# Patient Record
Sex: Female | Born: 1985 | Race: White | Hispanic: No | Marital: Single | Smoking: Current every day smoker
Health system: Southern US, Community
[De-identification: ages and names within clinical notes are randomized; demographics above are authoritative.]

---

## 2009-12-11 ENCOUNTER — Encounter (INDEPENDENT_AMBULATORY_CARE_PROVIDER_SITE_OTHER): Payer: Self-pay | Admitting: *Deleted

## 2009-12-11 ENCOUNTER — Ambulatory Visit: Payer: Self-pay | Admitting: Emergency Medicine

## 2009-12-11 LAB — CONVERTED CEMR LAB: Rapid Strep: POSITIVE

## 2010-03-12 NOTE — Assessment & Plan Note (Signed)
Summary: SORE THROAT/WB   Vital Signs:  Patient Profile:   25 Years Old Female CC:      sore throat, cough, HA x 2-3 days Height:     62 inches Weight:      137 pounds O2 Sat:      98 % O2 treatment:    Room Air Temp:     99.2 degrees F oral Pulse rate:   108 / minute Resp:     16 per minute BP sitting:   123 / 81  (left arm) Cuff size:   regular  Vitals Entered By: Lajean Saver RN (December 11, 2009 1:14 PM)                  Updated Prior Medication List: No Medications Current Allergies: ! * MINOCYCLINEHistory of Present Illness History from: patient Chief Complaint: sore throat, cough, HA x 2-3 days History of Present Illness: Patient complains of onset of cold symptoms for 3 days.  They have been using no OTC meds.  She smokes occasionally and works around cigarette smoke.  She also reports having yeast infx symptoms.  She was on Minocin up until a few weeks ago for acne, but stopped due to side effects.  She will start Keflex in a few weeks.  She has a family history of throat and esophagus problems. + sore throat No cough No pleuritic pain No wheezing No nasal congestion No post-nasal drainage No sinus pain/pressure No itchy/red eyes No earache No hemoptysis No SOB No chills/sweats No fever No nausea No vomiting No abdominal pain No diarrhea No skin rashes No fatigue No myalgias No headache   REVIEW OF SYSTEMS Constitutional Symptoms      Denies fever, chills, night sweats, weight loss, weight gain, and fatigue.  Eyes       Denies change in vision, eye pain, eye discharge, glasses, contact lenses, and eye surgery. Ear/Nose/Throat/Mouth       Complains of sore throat.      Denies hearing loss/aids, change in hearing, ear pain, ear discharge, dizziness, frequent runny nose, frequent nose bleeds, sinus problems, hoarseness, and tooth pain or bleeding.  Respiratory       Complains of dry cough.      Denies productive cough, wheezing, shortness of  breath, asthma, bronchitis, and emphysema/COPD.  Cardiovascular       Denies murmurs, chest pain, and tires easily with exhertion.    Gastrointestinal       Denies stomach pain, nausea/vomiting, diarrhea, constipation, blood in bowel movements, and indigestion. Genitourniary       Denies painful urination, kidney stones, and loss of urinary control. Neurological       Complains of headaches.      Denies paralysis, seizures, and fainting/blackouts. Musculoskeletal       Denies muscle pain, joint pain, joint stiffness, decreased range of motion, redness, swelling, muscle weakness, and gout.  Skin       Denies bruising, unusual mles/lumps or sores, and hair/skin or nail changes.  Psych       Denies mood changes, temper/anger issues, anxiety/stress, speech problems, depression, and sleep problems.  Past History:  Past Medical History: Unremarkable  Past Surgical History: Denies surgical history  Family History: none  Social History: Never Smoked Alcohol use-no Drug use-no Smoking Status:  never Drug Use:  no Physical Exam General appearance: well developed, well nourished, no acute distress Ears: normal, no lesions or deformities Nasal: mucosa pink, nonedematous, no septal deviation, turbinates normal Oral/Pharynx:  pharyngeal erythema without exudate, uvula midline without deviation Neck: L ant cerv LAD tender Chest/Lungs: no rales, wheezes, or rhonchi bilateral, breath sounds equal without effort Heart: regular rate and  rhythm, no murmur Skin: no obvious rashes or lesions MSE: oriented to time, place, and person Assessment New Problems: STREP THROAT (ICD-034.0) UPPER RESPIRATORY INFECTION, ACUTE (ICD-465.9)  Rapid strep +  Patient Education: Patient and/or caregiver instructed in the following: rest, fluids, Tylenol prn, quit smoking.  Plan New Medications/Changes: PREDNISONE 10 MG TABS (PREDNISONE) 20mg  two times a day for 3 days  #QS x 0, 12/11/2009, Hoyt Koch MD FLUCONAZOLE 150 MG TABS (FLUCONAZOLE) 1 tab x1.  May repeat in 2-3 days if symptoms not better  #2 x 0, 12/11/2009, Hoyt Koch MD PENICILLIN V POTASSIUM 500 MG TABS (PENICILLIN V POTASSIUM) 1 tab by mouth three times a day for 10 days  #20 x 0, 12/11/2009, Hoyt Koch MD  New Orders: New Patient Level III [id number] Rapid Strep [id number] Planning Comments:   Since this is a recurrent problem and you are around a lot of smoke and because of a family history of throat problems, refer to ENT for eval and treat Take meds as directed Follow-up with your primary care physician if not improving or if getting worse  Drink lots of water Change your toothbrush in 2 days You are contagious for 24 hours after starting the antibiotics   The patient and/or caregiver has been counseled thoroughly with regard to medications prescribed including dosage, schedule, interactions, rationale for use, and possible side effects and they verbalize understanding.  Diagnoses and expected course of recovery discussed and will return if not improved as expected or if the condition worsens. Patient and/or caregiver verbalized understanding.  Prescriptions: PREDNISONE 10 MG TABS (PREDNISONE) 20mg  two times a day for 3 days  #QS x 0   Entered and Authorized by:   Hoyt Koch MD   Signed by:   Hoyt Koch MD on 12/11/2009   Method used:   Print then Give to Patient   RxID:   [id number] FLUCONAZOLE 150 MG TABS (FLUCONAZOLE) 1 tab x1.  May repeat in 2-3 days if symptoms not better  #2 x 0   Entered and Authorized by:   Hoyt Koch MD   Signed by:   Hoyt Koch MD on 12/11/2009   Method used:   Print then Give to Patient   RxID:   [id number] PENICILLIN V POTASSIUM 500 MG TABS (PENICILLIN V POTASSIUM) 1 tab by mouth three times a day for 10 days  #20 x 0   Entered and Authorized by:   Hoyt Koch MD   Signed by:   Hoyt Koch MD on 12/11/2009   Method  used:   Print then Give to Patient   RxID:   [id number]   Orders Added: 1)  New Patient Level III [35573] 2)  Rapid Strep [22025]    Laboratory Results  Date/Time Received: December 11, 2009 1:28 PM  Date/Time Reported: December 11, 2009 1:28 PM   Other Tests  Rapid Strep: positive  Kit Test Internal QC: Negative   (Normal Range: Negative)

## 2010-03-12 NOTE — Letter (Signed)
Summary: Out of Work  [facility]  [address]   [place], [place] [address]   Phone: [phone]  Fax: [phone]    December 11, 2009   Employee:  Margot Chimes    To Whom It May Concern:   For Medical reasons, please excuse the above named employee from work for the following dates:  Start: November 1st, 2011    End:  November 2nd, 2011    If you need additional information, please feel free to contact our office.         Sincerely,    Lajean Saver RN

## 2018-02-28 ENCOUNTER — Other Ambulatory Visit: Payer: Self-pay

## 2018-02-28 ENCOUNTER — Emergency Department (INDEPENDENT_AMBULATORY_CARE_PROVIDER_SITE_OTHER)
Admission: EM | Admit: 2018-02-28 | Discharge: 2018-02-28 | Disposition: A | Discharge status: Home / Self Care | Payer: Medicaid Other | Source: Home / Self Care

## 2018-02-28 DIAGNOSIS — R69 Illness, unspecified: Secondary | ICD-10-CM | POA: Diagnosis not present

## 2018-02-28 DIAGNOSIS — J111 Influenza due to unidentified influenza virus with other respiratory manifestations: Secondary | ICD-10-CM

## 2018-02-28 MED ORDER — OSELTAMIVIR PHOSPHATE 75 MG PO CAPS: 75.0000 mg | ORAL_CAPSULE | Freq: Two times a day (BID) | ORAL | 0 refills | Status: DC

## 2018-02-28 NOTE — ED Triage Notes (Signed)
Pt c/o flu likes sxs for 4 days. Here today with daughter who has same sxs. Taking tylenol and motrin prn.

## 2018-02-28 NOTE — ED Provider Notes (Signed)
[facility]    CSN: [id number] Arrival date & time: 02/28/18  1601     History   Chief Complaint Chief Complaint  Patient presents with  . Headache  . Cough    sore throat  . Generalized Body Aches    HPI Anna Randall is a 33 y.o. female.   HPI Patient presented today concern for flu. She endorse 3 days of cough-non-productive, body aches, sore throat, and headache which has persisted. No known exposure. Her daughter is now sick. Unknown if fever. She has taken OTC medications for headache without relief of symptoms. Denies shortness of breath, wheezing, chest tightness, dizziness, or nausea.  Home Medications    Prior to Admission medications   Not on File    Family History No family history on file.  Social History Social History   Tobacco Use  . Smoking status: Not on file  Substance Use Topics  . Alcohol use: Not on file  . Drug use: Not on file     Allergies   Minocycline   Review of Systems Review of Systems Pertinent negatives listed in HPI Physical Exam Triage Vital Signs ED Triage Vitals  Enc Vitals Group     BP      Pulse      Resp      Temp      Temp src      SpO2      Weight      Height      Head Circumference      Peak Flow      Pain Score      Pain Loc      Pain Edu?      Excl. in [place]?    No data found.  Updated Vital Signs BP 117/81 (BP Location: Right Arm)   Pulse 81   Temp 98.1 F (36.7 C) (Oral)   Resp 18   Ht 5\' 2"  (1.575 m)   SpO2 99%   BMI 25.06 kg/m   Visual Acuity Right Eye Distance:   Left Eye Distance:   Bilateral Distance:    Right Eye Near:   Left Eye Near:    Bilateral Near:     Physical Exam General appearance: alert, well developed, appears acutely ill, cooperative and in no distress Head: Normocephalic, without obvious abnormality, atraumatic Nares: erythematous , mild congestion Ears: Bilaterally TM normal  Respiratory: Respirations even and unlabored, normal respiratory  rate Extremities: No gross deformities Skin: Skin color, texture, turgor normal. No rashes seen  Psych: Appropriate mood and affect. Neurologic: Mental status: Alert, oriented to person, place, and time, thought content appropriate. UC Treatments / Results  Labs (all labs ordered are listed, but only abnormal results are displayed) Labs Reviewed - No data to display  EKG None  Radiology No results found.  Procedures Procedures (including critical care time)  Medications Ordered in UC Medications - No data to display  Initial Impression / Assessment and Plan / UC Course  I have reviewed the triage vital signs and the nursing notes.  Pertinent labs & imaging results that were available during my care of the patient were reviewed by me and considered in my medical decision making (see chart for details).   Treating for a flu-like illness given symptoms and based on clinical judgement. Opt to forego influenza rapid test, given the length of time symptoms have been present. Treating Tamiflu based on symptoms. An After Visit Summary was printed and given to the patient. Precautions  discussed. Red flags discussed. Questions invited and answered. They voiced understanding and agreement.  Final Clinical Impressions(s) / UC Diagnoses   Final diagnoses:  Influenza-like illness     Discharge Instructions     For headache I recommend alternation tylenol and acetaminophen.  Rest and hydration will facilitate quicker resolution of symptoms.     ED Prescriptions    Medication Sig Dispense Auth. Provider   oseltamivir (TAMIFLU) 75 MG capsule Take 1 capsule (75 mg total) by mouth 2 (two) times daily. 10 capsule Bing Neighbors, FNP     Controlled Substance Prescriptions [place] Controlled Substance Registry consulted? Not Applicable   Bing Neighbors, FNP 02/28/18 1754

## 2018-02-28 NOTE — Discharge Instructions (Addendum)
For headache I recommend alternation tylenol and acetaminophen.  Rest and hydration will facilitate quicker resolution of symptoms.

## 2018-08-01 ENCOUNTER — Emergency Department (INDEPENDENT_AMBULATORY_CARE_PROVIDER_SITE_OTHER)
Admission: EM | Admit: 2018-08-01 | Discharge: 2018-08-01 | Disposition: A | Discharge status: Home / Self Care | Payer: Medicaid Other | Source: Home / Self Care | Attending: Family Medicine | Admitting: Family Medicine

## 2018-08-01 ENCOUNTER — Other Ambulatory Visit: Payer: Self-pay

## 2018-08-01 ENCOUNTER — Emergency Department (INDEPENDENT_AMBULATORY_CARE_PROVIDER_SITE_OTHER): Payer: Medicaid Other

## 2018-08-01 DIAGNOSIS — M869 Osteomyelitis, unspecified: Secondary | ICD-10-CM

## 2018-08-01 DIAGNOSIS — R102 Pelvic and perineal pain: Secondary | ICD-10-CM

## 2018-08-01 MED ORDER — PREDNISONE 20 MG PO TABS: ORAL_TABLET | ORAL | 0 refills | Status: DC

## 2018-08-01 NOTE — ED Provider Notes (Signed)
[facility]    CSN: [id number] Arrival date & time: 08/01/18  1517      History   Chief Complaint Chief Complaint  Patient presents with  . Abdominal Pain    HPI Anna Randall is a 33 y.o. female.   Patient complains of persistent lower abdominal/pelvic pain for about a month.  She points to an area just below a C-section scar.  She has minimal vaginal discharge and last menstrual period was two weeks ago.  She feels well otherwise. She was evaluated by her GYN on 07/13/18 with unremarkable pelvic exam and negative STD testing, including BV.  Pregnancy testing was negative as well.   Patient is requesting a pelvic ultrasound.  She states that she has a past history of ovarian cysts. OB history:  G2P2.  Vaginal delivery 17 years ago, and C-section 7 years ago.  The history is provided by the patient.  Abdominal Pain Pain location:  Suprapubic Pain quality: aching and shooting   Pain radiates to:  Does not radiate Pain severity:  Moderate Onset quality:  Sudden Duration:  4 weeks Timing:  Intermittent Progression:  Worsening Chronicity:  New Context: awakening from sleep, previous surgery and recent sexual activity   Context: not eating, not recent illness and not trauma   Relieved by:  Nothing Worsened by:  Movement (intercourse) Ineffective treatments:  None tried Associated symptoms: no anorexia, no chills, no constipation, no diarrhea, no dysuria, no fatigue, no fever, no hematuria, no melena, no nausea, no vaginal bleeding, no vaginal discharge and no vomiting   Risk factors: pregnancy     Past medical history negative  Active problems:  none   Past surgical history:  C-section  OB History    G2P2      Home Medications    Prior to Admission medications   Medication Sig Start Date End Date Taking? Authorizing Provider  predniSONE (DELTASONE) 20 MG tablet Take one tab by mouth twice daily for 4 days, then one daily. Take with food. 08/01/18    Kandra Nicolas, MD    Family History Ovarian cancer maternal aunt  Social History Social History   Tobacco Use  . Smoking status: Quit 09/10/09  . Smokeless tobacco: Never Used  Substance Use Topics  . Alcohol use: Not on file  . Drug use: Not on file     Allergies   Minocycline   Review of Systems Review of Systems  Constitutional: Negative for chills, fatigue and fever.  Gastrointestinal: Positive for abdominal pain. Negative for anorexia, constipation, diarrhea, melena, nausea and vomiting.  Genitourinary: Negative for dysuria, hematuria, vaginal bleeding and vaginal discharge.  All other systems reviewed and are negative.    Physical Exam Triage Vital Signs ED Triage Vitals  Enc Vitals Group     BP 08/01/18 1602 107/67     Pulse Rate 08/01/18 1602 81     Resp 08/01/18 1602 16     Temp 08/01/18 1602 98.3 F (36.8 C)     Temp Source 08/01/18 1602 Oral     SpO2 08/01/18 1602 100 %     Weight --      Height --      Head Circumference --      Peak Flow --      Pain Score 08/01/18 1603 4     Pain Loc --      Pain Edu? --      Excl. in [place]? --    No data found.  Updated  Vital Signs BP 107/67 (BP Location: Right Arm)   Pulse 81   Temp 98.3 F (36.8 C) (Oral)   Resp 16   SpO2 100%   Visual Acuity Right Eye Distance:   Left Eye Distance:   Bilateral Distance:    Right Eye Near:   Left Eye Near:    Bilateral Near:     Physical Exam Vitals signs and nursing note reviewed.  Constitutional:      General: She is not in acute distress. HENT:     Head: Normocephalic.     Nose: Nose normal.     Mouth/Throat:     Mouth: Mucous membranes are moist.  Eyes:     Pupils: Pupils are equal, round, and reactive to light.  Neck:     Musculoskeletal: Normal range of motion.  Cardiovascular:     Heart sounds: Normal heart sounds.  Pulmonary:     Breath sounds: Normal breath sounds.  Abdominal:     General: Abdomen is flat.     Palpations: There is no  mass.     Tenderness: There is no abdominal tenderness. There is no right CVA tenderness, left CVA tenderness or guarding.     Hernia: No hernia is present.  Musculoskeletal:     Right lower leg: No edema.     Left lower leg: No edema.       Legs:     Comments: Patient has distinct tenderness over symphysis pubis bilaterally.  Palpation there during resisted lateral adduction, and flexion of hips, recreates her lower abdominal pain   Skin:    General: Skin is warm and dry.     Findings: No rash.  Neurological:     Mental Status: She is alert.      UC Treatments / Results  Labs (all labs ordered are listed, but only abnormal results are displayed) Labs Reviewed - No data to display  EKG None  Radiology No results found.  Procedures Procedures (including critical care time)  Medications Ordered in UC Medications - No data to display  Initial Impression / Assessment and Plan / UC Course  I have reviewed the triage vital signs and the nursing notes.  Pertinent labs & imaging results that were available during my care of the patient were reviewed by me and considered in my medical decision making (see chart for details).    Begin prednisone burst/taper. Patient's pain appears to be primarily a result of osteitis pubis.  However, will order pelvic ultrasound at patient's request. Followup with Dr. Rodney Langtonhomas Thekkekandam or Dr. Clementeen GrahamEvan [facility]y ([facility] for further evaluation.   Final Clinical Impressions(s) / UC Diagnoses   Final diagnoses:  Osteitis pubis (HCC)  Pelvic pain     Discharge Instructions     Apply ice pack for 20 to 30 minutes, 3 to 4 times daily  Continue until pain and swelling decrease.  Begin stretching exercises as tolerated   ED Prescriptions    Medication Sig Dispense Auth. Provider   predniSONE (DELTASONE) 20 MG tablet Take one tab by mouth twice daily for 4 days, then one daily. Take with food. 12 tablet Lattie HawBeese, Anagabriela Jokerst A, MD          Lattie HawBeese, Hyder Deman A, MD 08/01/18 [phone]

## 2018-08-01 NOTE — ED Triage Notes (Signed)
Pt c/o lower pelvic pain x 60mos. Pt has seen OB and has had neg pregnancy test, UA, and STD test. Pain wrose with intercourse. HX of ovarian cysts.

## 2018-08-01 NOTE — Discharge Instructions (Signed)
Apply ice pack for 20 to 30 minutes, 3 to 4 times daily  Continue until pain and swelling decrease.  Begin stretching exercises as tolerated

## 2018-08-02 ENCOUNTER — Telehealth: Payer: Self-pay

## 2018-08-02 NOTE — Telephone Encounter (Signed)
Tried to call to schedule [place]. Both her and emergency contact numbers are not in use/disconnected. Also found pts drivers license in office this morning.

## 2018-08-02 NOTE — Telephone Encounter (Signed)
Pt called back to schedule [place]. Spoke to [employer] down in [place]. Appt Thurs 6/26  at 10:15am. Pt confirmed.

## 2018-08-05 ENCOUNTER — Other Ambulatory Visit: Payer: Self-pay | Admitting: Family Medicine

## 2018-08-05 ENCOUNTER — Telehealth: Payer: Self-pay

## 2018-08-05 ENCOUNTER — Ambulatory Visit (INDEPENDENT_AMBULATORY_CARE_PROVIDER_SITE_OTHER): Payer: Medicaid Other

## 2018-08-05 ENCOUNTER — Other Ambulatory Visit: Payer: Self-pay

## 2018-08-05 DIAGNOSIS — R102 Pelvic and perineal pain: Secondary | ICD-10-CM

## 2018-08-05 NOTE — Telephone Encounter (Signed)
Notified patient of [place] results and to follow up with OBGYN for follow up US and results.

## 2018-10-28 ENCOUNTER — Telehealth: Payer: Self-pay | Admitting: Emergency Medicine

## 2018-10-28 NOTE — Telephone Encounter (Signed)
Patient called wanted new rx for prednisone which was written 08/01/18, I advised her she would need to be re evaluated.She hung up on me.

## 2020-06-11 ENCOUNTER — Other Ambulatory Visit: Payer: Self-pay

## 2020-06-11 ENCOUNTER — Emergency Department (INDEPENDENT_AMBULATORY_CARE_PROVIDER_SITE_OTHER)
Admission: RE | Admit: 2020-06-11 | Discharge: 2020-06-11 | Disposition: A | Discharge status: Home / Self Care | Payer: Medicaid Other | Source: Ambulatory Visit | Attending: Family Medicine | Admitting: Family Medicine

## 2020-06-11 ENCOUNTER — Emergency Department (INDEPENDENT_AMBULATORY_CARE_PROVIDER_SITE_OTHER): Payer: Medicaid Other

## 2020-06-11 VITALS — BP 116/83 | HR 78 | Temp 98.4°F | Resp 18

## 2020-06-11 DIAGNOSIS — R1030 Lower abdominal pain, unspecified: Secondary | ICD-10-CM

## 2020-06-11 DIAGNOSIS — R109 Unspecified abdominal pain: Secondary | ICD-10-CM | POA: Diagnosis not present

## 2020-06-11 MED ORDER — POLYETHYLENE GLYCOL 3350 17 G PO PACK: 17.0000 g | PACK | Freq: Every day | ORAL | 0 refills | Status: DC

## 2020-06-11 MED ORDER — ONDANSETRON HCL 8 MG PO TABS: 8.0000 mg | ORAL_TABLET | Freq: Three times a day (TID) | ORAL | 0 refills | Status: DC | PRN

## 2020-06-11 NOTE — ED Notes (Signed)
Pt unable to give specimen during visit. Per Provider, Ok to discontinue UA order.

## 2020-06-11 NOTE — ED Provider Notes (Signed)
[facility]    CSN: [id number] Arrival date & time: 06/11/20  1150      History   Chief Complaint Chief Complaint  Patient presents with  . Constipation  . Back Pain    Lower    HPI Anna Randall is a 35 y.o. female.   HPI   35 year old woman who is here for abdominal pain.  She states that she has had problems with her bowels since a child.  She remembers her mother giving her enemas when she was 5.  She states that there is an assumption that she has irritable bowel syndrome since she has alternating constipation and diarrhea.  She has not seen a gastroenterologist, she has not had a colonoscopy.  She has an aversion to colonoscopy screening.  She is not on an IBS diet, she states "I feel better when I eat junk".  Apparently has random abdominal pain, constipation, or diarrhea.  Unpredictable according to patient.  She states that she had an event to attend and did not want to have diarrhea.  She therefore took 4 Imodium before the event so she would be sure not to move her bowels.  Since then she has been having some trouble with constipation.  She states that right now she is only having small bowel movements of either hard stool or liquid stool.  She states she has had abdominal pain since last night.  Nausea and vomiting.  No fever or chills.  No headache or body ache.  Patient does have a past history of vitamin B deficiency, anemia, anxiety.    History reviewed. No pertinent past medical history.  There are no problems to display for this patient.   History reviewed. No pertinent surgical history.  OB History   No obstetric history on file.      Home Medications    Prior to Admission medications   Medication Sig Start Date End Date Taking? Authorizing Provider  ondansetron (ZOFRAN) 8 MG tablet Take 1 tablet (8 mg total) by mouth every 8 (eight) hours as needed for nausea or vomiting. 06/11/20  Yes Eustace Moore, MD  polyethylene glycol (MIRALAX /  GLYCOLAX) 17 g packet Take 17 g by mouth daily. 06/11/20  Yes Eustace Moore, MD    Family History History reviewed. No pertinent family history.  Social History Social History   Tobacco Use  . Smoking status: Never Smoker  . Smokeless tobacco: Never Used  Vaping Use  . Vaping Use: Never used     Allergies   Minocycline   Review of Systems Review of Systems See HPI  Physical Exam Triage Vital Signs ED Triage Vitals  Enc Vitals Group     BP 06/11/20 1202 116/83     Pulse Rate 06/11/20 1202 78     Resp 06/11/20 1202 18     Temp 06/11/20 1202 98.4 F (36.9 C)     Temp Source 06/11/20 1202 Oral     SpO2 06/11/20 1202 99 %     Weight --      Height --      Head Circumference --      Peak Flow --      Pain Score 06/11/20 1204 7     Pain Loc --      Pain Edu? --      Excl. in GC? --    No data found.  Updated Vital Signs BP 116/83 (BP Location: Right Arm)   Pulse 78   Temp  98.4 F (36.9 C) (Oral)   Resp 18   LMP 05/14/2020 (Approximate)   SpO2 99%       Physical Exam Constitutional:      General: She is not in acute distress.    Appearance: Normal appearance. She is well-developed and normal weight.  HENT:     Head: Normocephalic and atraumatic.     Mouth/Throat:     Comments: Mask is in place Eyes:     Conjunctiva/sclera: Conjunctivae normal.     Pupils: Pupils are equal, round, and reactive to light.  Cardiovascular:     Rate and Rhythm: Normal rate and regular rhythm.     Heart sounds: Normal heart sounds.  Pulmonary:     Effort: Pulmonary effort is normal. No respiratory distress.     Breath sounds: Normal breath sounds.  Abdominal:     General: Bowel sounds are normal. There is no distension.     Palpations: Abdomen is soft.     Tenderness: There is abdominal tenderness. There is no guarding or rebound.     Comments: No mass or organomegaly.  Moderate tenderness to deep palpation in the right lower quadrant and suprapubic region.   Musculoskeletal:        General: Normal range of motion.     Cervical back: Normal range of motion.  Skin:    General: Skin is warm and dry.  Neurological:     Mental Status: She is alert.  Psychiatric:     Comments: Emotional lability      UC Treatments / Results  Labs (all labs ordered are listed, but only abnormal results are displayed) Labs Reviewed  POCT URINALYSIS DIP (MANUAL ENTRY)    EKG   Radiology DG Abdomen 1 View  Result Date: 06/11/2020 CLINICAL DATA:  Abdominal pain for 2 days EXAM: ABDOMEN - 1 VIEW COMPARISON:  None. FINDINGS: Scattered large and small bowel gas is noted. No significant retained fecal material is noted. No free air is seen. No abnormal mass or abnormal calcifications are noted. Phleboliths are seen in the pelvis. No bony abnormality is noted. IMPRESSION: No acute abnormality noted. Electronically Signed   By: Alcide Clever M.D.   On: 06/11/2020 12:47    Procedures Procedures (including critical care time)  Medications Ordered in UC Medications - No data to display  Initial Impression / Assessment and Plan / UC Course  I have reviewed the triage vital signs and the nursing notes.  Pertinent labs & imaging results that were available during my care of the patient were reviewed by me and considered in my medical decision making (see chart for details).    Patient is frustrated with her IBS.  She also is not worked up, treated, or working on any dietary management of her IBS.  I explained to her that her abdominal x-ray did not show any obstruction or serious condition.  I believe she needs MiraLAX right now for regulation of bowels.  Should take it every day or every other day as needed.  I do recommend an IBS diet and have provided her with 1.  I do recommend GI follow-up which she declines due to the her perceived notion that a colonoscopy is required.  Follow-up with PCP Final Clinical Impressions(s) / UC Diagnoses   Final diagnoses:  Lower  abdominal pain     Discharge Instructions     I have included information on the irritable bowel syndrome recommended diet Make sure that you are drinking plenty of water  Consider adding a probiotic to your daily regimen I recommend MiraLAX to help with bowel movements.  Take twice a day until you have a bowel movement and then once every day or every other day to stay regular I have prescribed Zofran for nausea in case it is needed Follow-up with your primary care doctor.  I do recommend a gastroenterology consultation   ED Prescriptions    Medication Sig Dispense Auth. Provider   ondansetron (ZOFRAN) 8 MG tablet Take 1 tablet (8 mg total) by mouth every 8 (eight) hours as needed for nausea or vomiting. 20 tablet Eustace Moore, MD   polyethylene glycol (MIRALAX / GLYCOLAX) 17 g packet Take 17 g by mouth daily. 14 each Eustace Moore, MD     PDMP not reviewed this encounter.   Eustace Moore, MD 06/11/20 1322

## 2020-06-11 NOTE — ED Notes (Signed)
Pt returned to room by rad tech.

## 2020-06-11 NOTE — Discharge Instructions (Addendum)
I have included information on the irritable bowel syndrome recommended diet Make sure that you are drinking plenty of water Consider adding a probiotic to your daily regimen I recommend MiraLAX to help with bowel movements.  Take twice a day until you have a bowel movement and then once every day or every other day to stay regular I have prescribed Zofran for nausea in case it is needed Follow-up with your primary care doctor.  I do recommend a gastroenterology consultation

## 2020-06-11 NOTE — ED Triage Notes (Signed)
Pt states she was wakened two nights ago with abd pain. Nausea x 2 days. Also having some lower back pain. Small BMs last couple days. Has had a few episodes of emesis yesterday while straining on toilet yesterday.  Has not tried any laxatives for fear of obstruction. Thinks she has noticed some red mucous in her stool, but also had some red candy the day prior.  Pain 7/10  Pt adds took 4 immodium last week even though she didn't have diarrhea.

## 2020-07-18 IMAGING — DX PELVIS - 1-2 VIEW
1 series · 1 of 1 positions shown · non-contrast
Comparison: None.

CLINICAL DATA: Pelvic pain 1 month.  No injury.

EXAM:
PELVIS - 1-2 VIEW

[pelvis ap]
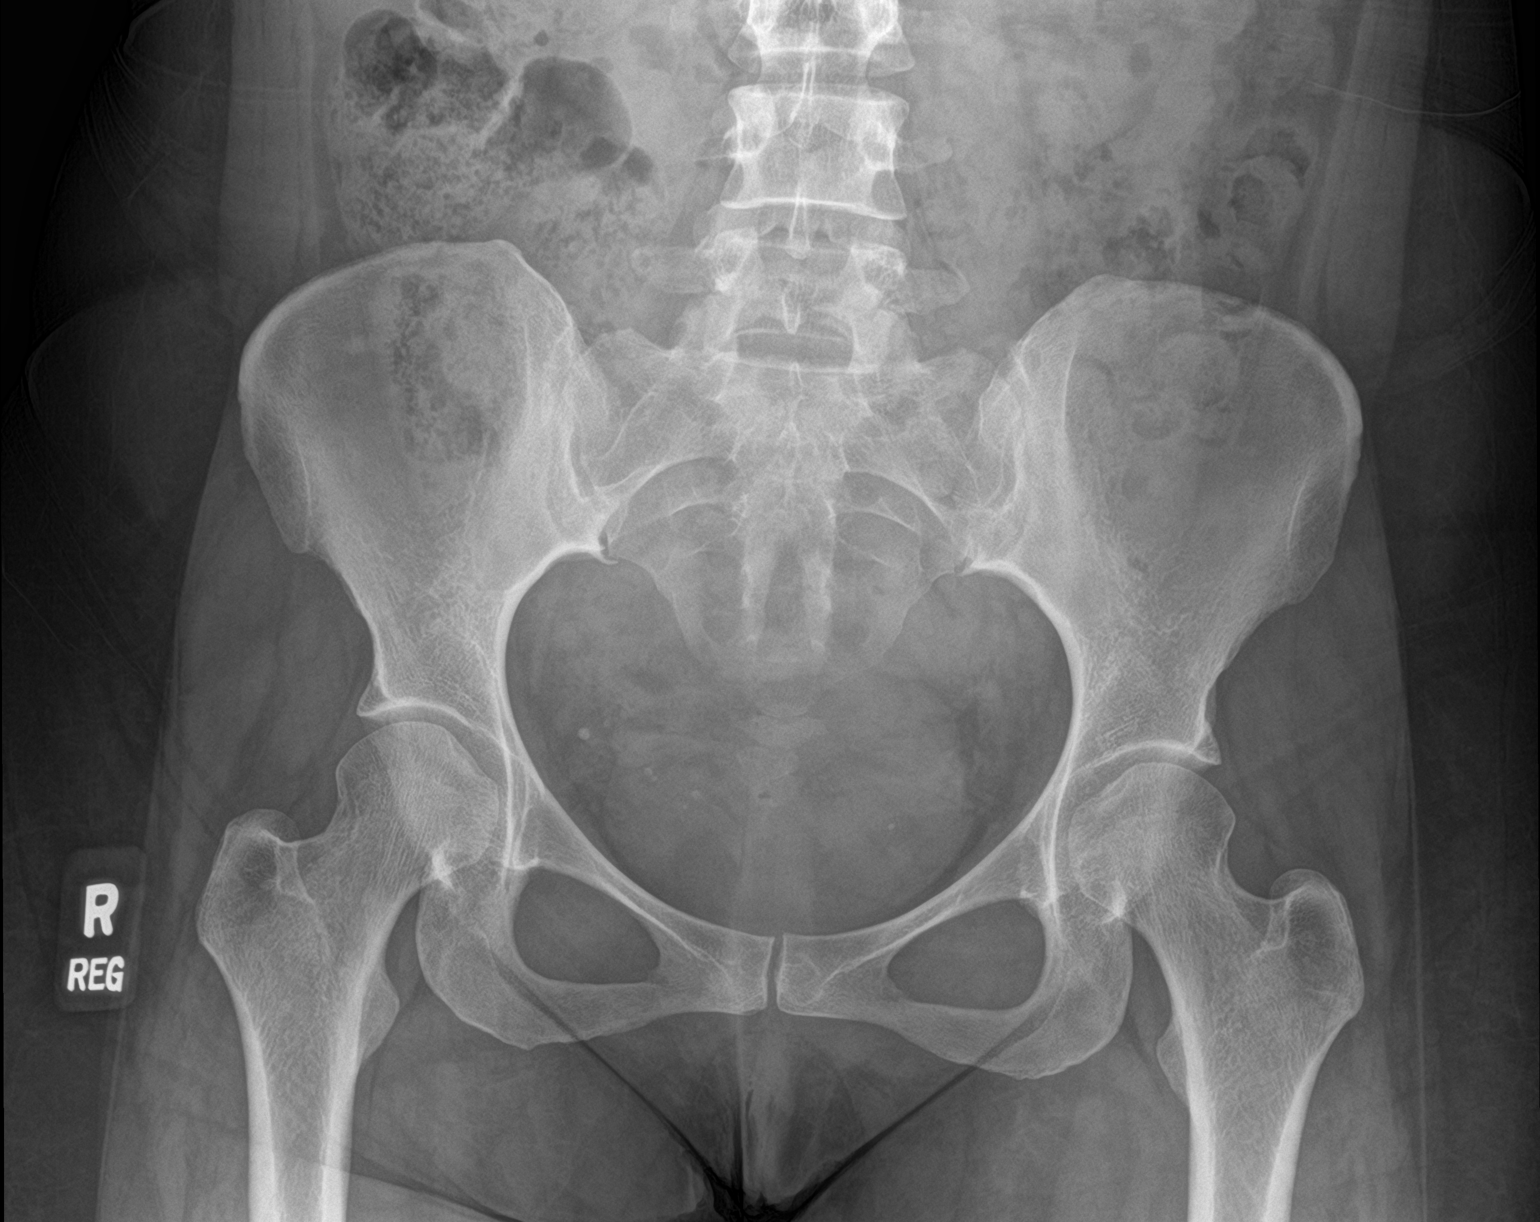

[1 of 1 positions shown; findings below may reference images not displayed]

FINDINGS: Minimal degenerative change of the sacroiliac joints. Remaining
bones, joint spaces and soft tissues are within normal. No evidence
of acute fracture or dislocation.
IMPRESSION: No acute findings.

## 2021-05-27 ENCOUNTER — Emergency Department (INDEPENDENT_AMBULATORY_CARE_PROVIDER_SITE_OTHER)
Admission: RE | Admit: 2021-05-27 | Discharge: 2021-05-27 | Disposition: A | Discharge status: Home / Self Care | Payer: Medicaid Other | Source: Ambulatory Visit | Attending: Family Medicine | Admitting: Family Medicine

## 2021-05-27 VITALS — BP 102/74 | HR 82 | Temp 98.4°F | Resp 18 | Ht 62.0 in | Wt 145.0 lb

## 2021-05-27 DIAGNOSIS — L299 Pruritus, unspecified: Secondary | ICD-10-CM | POA: Diagnosis not present

## 2021-05-27 MED ORDER — IVERMECTIN 0.5 % EX LOTN: TOPICAL_LOTION | CUTANEOUS | 1 refills | Status: DC

## 2021-05-27 MED ORDER — FLUOCINOLONE ACETONIDE SCALP 0.01 % EX OIL: TOPICAL_OIL | CUTANEOUS | 0 refills | Status: DC

## 2021-05-27 NOTE — ED Provider Notes (Signed)
?[facility] ? ? ? ?CSN: [id number] ?Arrival date & time: 05/27/21  1349 ? ? ?  ? ?History   ?Chief Complaint ?Chief Complaint  ?Patient presents with  ? Possible head lice  ? ? ?HPI ?Anna Randall is a 36 y.o. female.  ? ?Patient complains of itching scalp today.  She had treated her scalp (and her daughter's also) for head lice with an OTC product because she had found a louse on her daughter's scalp.  Patient has not actually identified lice or nits on her own scalp. ? ?The history is provided by the patient.  ? ?History reviewed. No pertinent past medical history. ? ?There are no problems to display for this patient. ? ? ?Past Surgical History:  ?Procedure Laterality Date  ? CESAREAN SECTION    ? ? ?OB History   ?No obstetric history on file. ?  ? ? ? ?Home Medications   ? ?Prior to Admission medications   ?Medication Sig Start Date End Date Taking? Authorizing Provider  ?Fluocinolone Acetonide Scalp (DERMA-SMOOTHE/FS SCALP) 0.01 % OIL Apply to scalp at bedtime; wash off next morning.  Use for 2 to 3 days. 05/27/21  Yes Lattie Haw, MD  ?Ivermectin 0.5 % LOTN Apply to dry hair and thoroughly massage into hair.  Leave on 10 minutes then rinse off 05/27/21  Yes Tammie Ellsworth, Tera Mater, MD  ?OVER THE COUNTER MEDICATION OTC lice treatment   Yes [provider]  ?ondansetron (ZOFRAN) 8 MG tablet Take 1 tablet (8 mg total) by mouth every 8 (eight) hours as needed for nausea or vomiting. 06/11/20   Eustace Moore, MD  ?polyethylene glycol (MIRALAX / GLYCOLAX) 17 g packet Take 17 g by mouth daily. 06/11/20   Eustace Moore, MD  ? ? ?Family History ?Family History  ?Problem Relation Age of Onset  ? Cancer Mother   ? Glaucoma Mother   ? Osteoarthritis Mother   ? Fibromyalgia Mother   ? Diabetes Father   ? ? ?Social History ?Social History  ? ?Tobacco Use  ? Smoking status: Every Day  ?  Packs/day: 0.25  ?  Types: Cigarettes  ? Smokeless tobacco: Never  ?Vaping Use  ? Vaping Use: Never used  ?Substance  Use Topics  ? Alcohol use: Yes  ?  Comment: rarely  ? Drug use: Not Currently  ? ? ? ?Allergies   ?Minocycline ? ? ?Review of Systems ?Review of Systems  ?Constitutional: Negative.   ?HENT:    ?     Pruritic scalp  ?Skin:  Negative for rash.  ?All other systems reviewed and are negative. ? ? ?Physical Exam ?Triage Vital Signs ?ED Triage Vitals  ?Enc Vitals Group  ?   BP 05/27/21 1427 102/74  ?   Pulse Rate 05/27/21 1427 82  ?   Resp 05/27/21 1427 18  ?   Temp 05/27/21 1427 98.4 ?F (36.9 ?C)  ?   Temp Source 05/27/21 1427 Oral  ?   SpO2 05/27/21 1427 99 %  ?   Weight 05/27/21 1422 145 lb (65.8 kg)  ?   Height 05/27/21 1422 5\' 2"  (1.575 m)  ?   Head Circumference --   ?   Peak Flow --   ?   Pain Score 05/27/21 1421 4  ?   Pain Loc --   ?   Pain Edu? --   ?   Excl. in GC? --   ? ?No data found. ? ?Updated Vital Signs ?BP 102/74 (BP  Location: Right Arm)   Pulse 82   Temp 98.4 ?F (36.9 ?C) (Oral)   Resp 18   Ht 5\' 2"  (1.575 m)   Wt 65.8 kg   LMP 05/08/2021 (Approximate)   SpO2 99%   BMI 26.52 kg/m?  ? ?Visual Acuity ?Right Eye Distance:   ?Left Eye Distance:   ?Bilateral Distance:   ? ?Right Eye Near:   ?Left Eye Near:    ?Bilateral Near:    ? ?Physical Exam ?Vitals and nursing note reviewed.  ?Constitutional:   ?   General: She is not in acute distress. ?HENT:  ?   Head: Normocephalic.  ?   Comments: Scalp:  normal without evidence inflammation, lesions, or lice.  Hair normal; no nits identified. ?   Right Ear: External ear normal.  ?   Left Ear: External ear normal.  ?   Nose: Nose normal.  ?   Mouth/Throat:  ?   Mouth: Mucous membranes are moist.  ?Eyes:  ?   Conjunctiva/sclera: Conjunctivae normal.  ?   Pupils: Pupils are equal, round, and reactive to light.  ?Cardiovascular:  ?   Rate and Rhythm: Normal rate.  ?Pulmonary:  ?   Effort: Pulmonary effort is normal.  ?Musculoskeletal:  ?   Cervical back: Neck supple.  ?Lymphadenopathy:  ?   Cervical: No cervical adenopathy.  ?Skin: ?   General: Skin is warm.  ?    Findings: No rash.  ?Neurological:  ?   Mental Status: She is alert.  ? ? ? ?UC Treatments / Results  ?Labs ?(all labs ordered are listed, but only abnormal results are displayed) ?Labs Reviewed - No data to display ? ?EKG ? ? ?Radiology ?No results found. ? ?Procedures ?Procedures (including critical care time) ? ?Medications Ordered in UC ?Medications - No data to display ? ?Initial Impression / Assessment and Plan / UC Course  ?I have reviewed the triage vital signs and the nursing notes. ? ?Pertinent labs & imaging results that were available during my care of the patient were reviewed by me and considered in my medical decision making (see chart for details). ? ?  ?Although no nits or lice identified on patient, will treat empirically because of daughter's pediculosis. ?Rx for Ivermectin (one treatment). For pruritus will Rx Derma-Smoothe/FS for 2 to 3 nights until itching resolves (to begin after treating with ivermectin). ?Followup with Family Doctor or dermatologist if not improved in two weeks. ? ?Final Clinical Impressions(s) / UC Diagnoses  ? ?Final diagnoses:  ?Pruritus of scalp  ? ? ? ?Discharge Instructions   ? ?  ?Use Derma-Smoothe scalp oil AFTER treating with ivermectin. ? ? ? ?ED Prescriptions   ? ? Medication Sig Dispense Auth. Provider  ? Ivermectin 0.5 % LOTN Apply to dry hair and thoroughly massage into hair.  Leave on 10 minutes then rinse off 117 g 05/10/2021, MD  ? Fluocinolone Acetonide Scalp (DERMA-SMOOTHE/FS SCALP) 0.01 % OIL Apply to scalp at bedtime; wash off next morning.  Use for 2 to 3 days. 118.28 mL Lattie Haw, MD  ? ?  ? ? ?  ?Lattie Haw, MD ?05/29/21 [phone] ? ?

## 2021-05-27 NOTE — Discharge Instructions (Signed)
Use Derma-Smoothe scalp oil AFTER treating with ivermectin. ?

## 2021-05-27 NOTE — ED Triage Notes (Signed)
Pt presents to [facility]gent Care with c/o itchy scalp today. Reports she found a louse on her daughter's head yesterday. She did OTC lice treatment on herself and daughter last night.  ?

## 2021-05-29 ENCOUNTER — Telehealth: Payer: Self-pay | Admitting: Emergency Medicine

## 2021-05-29 ENCOUNTER — Telehealth: Payer: Self-pay | Admitting: Family Medicine

## 2021-05-29 MED ORDER — NIX CREME RINSE 1 % EX LIQD: Freq: Once | CUTANEOUS | 0 refills | Status: AC

## 2021-05-29 NOTE — Telephone Encounter (Addendum)
Return call by Emeline Darling- previous call had been placed to [facility]. Patient was verbally abusive to staff on both phone calls. First call pt stated " I need to speak to the dumb ass doctor who didn't send in my medicine" CMA asked for patient's name and DOB after patient continued to yell at [facility] over the phone after CMA made multiple attempts to obtain patient's name and DOB so she could look up the patient's record. Pt continued to yell at [facility]. CMA hung up on patient due to verbal abuse. Second call, RN spoke with pt who did not state her name, just that the medicine was not sent in. RN asked for pt's name and DOB and explained we can not find out what medicine she is missing. Pt continued to yell at RN when RN explained that both medicines were received by the pharmacy via electronic receipt. RN asked if pharmacy did not have one of the medicines. Pt continued to yell at nurse and states nurse was not listening when nurse was trying to tell the patient she would review chart with the provider here tonight & would follow up with the pharmacy. Pt continued to yell at nurse and state medication needed to be sent over. " I spoke with the doctor, this is the only medicine that will work, I have spent $600 dollars on other medicine that does not work" pt continued to yell. RN disconnected call. Updated provider & RN will call pharmacy. Call placed to pharmacy who stated they have the medication but it was not covered under the pt's insurance. RN asked pharmacy to call patient and let her know they did have her medication and options she had to pay for it.Fax received from pharmacy requested another medication due to current one not being covered. Fax given to Dr Delton See ?

## 2021-05-29 NOTE — Telephone Encounter (Signed)
Pharmacy called.  Ivermectin nor covered by her insurance.  She desires alternative.  Chart reviewed.  Permethrin lotion 1% authorized to pharmacy ?

## 2021-05-30 ENCOUNTER — Telehealth: Payer: Self-pay | Admitting: Family Medicine

## 2021-05-30 MED ORDER — SPINOSAD 0.9 % EX SUSP: 1.0000 "application " | Freq: Once | CUTANEOUS | 0 refills | Status: AC

## 2021-05-30 NOTE — Telephone Encounter (Signed)
Patient called again.  Quite talkative.  Quite angry.  Specifically requests new prescription for her lice. ?I did speak to the patient about her conversations with my nurses, or foul language, or argumentative nature.  I told her that she needs to be polite where she will not receive care here. ?

## 2021-05-31 ENCOUNTER — Telehealth: Payer: Self-pay

## 2021-05-31 MED ORDER — SPINOSAD 0.9 % EX SUSP: CUTANEOUS | 0 refills | Status: DC

## 2021-05-31 NOTE — Telephone Encounter (Signed)
Natroba sent to patient's pharmacy per request. ?

## 2021-06-02 ENCOUNTER — Telehealth: Payer: Self-pay

## 2021-06-02 NOTE — Telephone Encounter (Signed)
Telephone call received from pt stating pharmacy did not have rx for lice. This RN contacted pharmacy and per [facility] on [address] rx is there and in stock. Pt notified that rx is in stock. Pharmacy called agin by this nurse and rx verified natroba is filled for pt.  ?

## 2022-05-23 ENCOUNTER — Ambulatory Visit
Admission: RE | Admit: 2022-05-23 | Discharge: 2022-05-23 | Disposition: A | Discharge status: Home / Self Care | Payer: Medicaid Other | Source: Ambulatory Visit | Attending: Family Medicine | Admitting: Family Medicine

## 2022-05-23 VITALS — BP 108/76 | HR 88 | Temp 98.6°F | Resp 16 | Ht 62.0 in | Wt 145.1 lb

## 2022-05-23 DIAGNOSIS — H6693 Otitis media, unspecified, bilateral: Secondary | ICD-10-CM

## 2022-05-23 MED ORDER — AMOXICILLIN-POT CLAVULANATE 875-125 MG PO TABS: 1.0000 | ORAL_TABLET | Freq: Two times a day (BID) | ORAL | 0 refills | Status: AC

## 2022-05-23 MED ORDER — FLUCONAZOLE 150 MG PO TABS: ORAL_TABLET | ORAL | 0 refills | Status: AC

## 2022-05-23 NOTE — Discharge Instructions (Addendum)
Advised patient to take medication as directed with food to completion.  Encouraged patient increase daily water intake 64 ounces per day while taking this medication.  Advised if symptoms worsen and/or unresolved please follow-up with PCP or ENT for further evaluation.

## 2022-05-23 NOTE — ED Provider Notes (Signed)
[facility]    CSN: [id number] Arrival date & time: 05/23/22  1857      History   Chief Complaint Chief Complaint  Patient presents with   Otalgia    Bilateral     HPI Anna Randall is a 37 y.o. female.   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  Additionally reports sinus nasal congestion for 1 week.  History reviewed. No pertinent past medical history.  There are no problems to display for this patient.   Past Surgical History:  Procedure Laterality Date   CESAREAN SECTION      OB History   No obstetric history on file.      Home Medications    Prior to Admission medications   Medication Sig Start Date End Date Taking? Authorizing Provider  amoxicillin-clavulanate (AUGMENTIN) 875-125 MG tablet Take 1 tablet by mouth 2 (two) times daily for 10 days. 05/23/22 06/02/22 Yes Trevor Iha, FNP  fluconazole (DIFLUCAN) 150 MG tablet Take 1 tab p.o. for vaginal candidiasis, may repeat 1 tab p.o. in 3 days if symptoms are not resolved. 05/23/22  Yes Trevor Iha, FNP  Fluocinolone Acetonide Scalp (DERMA-SMOOTHE/FS SCALP) 0.01 % OIL Apply to scalp at bedtime; wash off next morning.  Use for 2 to 3 days. Patient not taking: Reported on 05/23/2022 05/27/21   Lattie Haw, MD  Ivermectin 0.5 % LOTN Apply to dry hair and thoroughly massage into hair.  Leave on 10 minutes then rinse off Patient not taking: Reported on 05/23/2022 05/27/21   Lattie Haw, MD  ondansetron (ZOFRAN) 8 MG tablet Take 1 tablet (8 mg total) by mouth every 8 (eight) hours as needed for nausea or vomiting. Patient not taking: Reported on 05/23/2022 06/11/20   Eustace Moore, MD  OVER THE COUNTER MEDICATION OTC lice treatment Patient not taking: Reported on 05/23/2022    [provider]   polyethylene glycol (MIRALAX / GLYCOLAX) 17 g packet Take 17 g by mouth daily. Patient not taking: Reported on 05/23/2022 06/11/20   Eustace Moore, MD  Spinosad 0.9 % SUSP Apply x1  to scalp leave on for 10 minutes; may repeat in 1 week if live lice present Patient not taking: Reported on 05/23/2022 05/31/21   Trevor Iha, FNP    Family History Family History  Problem Relation Age of Onset   Cancer Mother    Glaucoma Mother    Osteoarthritis Mother    Fibromyalgia Mother    Diabetes Father     Social History Social History   Tobacco Use   Smoking status: Every Day    Packs/day: .25    Types: Cigarettes   Smokeless tobacco: Never  Vaping Use   Vaping Use: Never used  Substance Use Topics   Alcohol use: Yes    Comment: rarely   Drug use: Not Currently     Allergies   Doxycycline, Cephalexin, and Minocycline   Review of Systems Review of Systems  HENT:  Positive for ear pain.      Physical Exam Triage Vital Signs ED Triage Vitals  Enc Vitals Group     BP      Pulse      Resp      Temp      Temp src      SpO2  Weight      Height      Head Circumference      Peak Flow      Pain Score      Pain Loc      Pain Edu?      Excl. in GC?    No data found.  Updated Vital Signs BP 108/76 (BP Location: Right Arm)   Pulse 88   Temp 98.6 F (37 C) (Oral)   Resp 16   Ht  (1.575 m)   Wt 145 lb 1 oz (65.8 kg)   LMP 05/08/2022 (Approximate)   SpO2 99%   BMI 26.53 kg/m       Physical Exam Vitals and nursing note reviewed.  Constitutional:      Appearance: Normal appearance. She is obese.  HENT:     Head: Normocephalic and atraumatic.     Right Ear: External ear normal.     Left Ear: External ear normal.     Ears:     Comments: EAC's are clear, TM's are erythematous, red rimmed, retracted; mild eustachian tube dysfunction noted bilaterally    Mouth/Throat:     Mouth: Mucous membranes are moist.     Pharynx: Oropharynx is clear.  Eyes:      Extraocular Movements: Extraocular movements intact.     Conjunctiva/sclera: Conjunctivae normal.     Pupils: Pupils are equal, round, and reactive to light.  Cardiovascular:     Rate and Rhythm: Normal rate and regular rhythm.     Pulses: Normal pulses.     Heart sounds: Normal heart sounds.  Pulmonary:     Effort: Pulmonary effort is normal.     Breath sounds: Normal breath sounds. No wheezing, rhonchi or rales.  Musculoskeletal:        General: Normal range of motion.     Cervical back: Normal range of motion and neck supple.  Skin:    General: Skin is warm and dry.  Neurological:     General: No focal deficit present.     Mental Status: She is alert and oriented to person, place, and time. Mental status is at baseline.      UC Treatments / Results  Labs (all labs ordered are listed, but only abnormal results are displayed) Labs Reviewed - No data to display  EKG   Radiology No results found.  Procedures Procedures (including critical care time)  Medications Ordered in UC Medications - No data to display  Initial Impression / Assessment and Plan / UC Course  I have reviewed the triage vital signs and the nursing notes.  Pertinent labs & imaging results that were available during my care of the patient were reviewed by me and considered in my medical decision making (see chart for details).    MDM: 1.  Acute bilateral otitis media-Rx'd Augmentin 875/125 mg tablet twice daily x 10 days. Advised patient to take medication as directed with food to completion.  Encouraged patient increase daily water intake 64 ounces per day while taking this medication.  Advised if symptoms worsen and/or unresolved please follow-up with PCP or ENT for further evaluation.  Patient discharged home, hemodynamically stable. Final Clinical Impressions(s) / UC Diagnoses   Final diagnoses:  Acute bilateral otitis media     Discharge Instructions      Advised patient to take  medication as directed with food to completion.  Encouraged patient increase daily water intake 64 ounces per day while taking this medication.  Advised if symptoms  worsen and/or unresolved please follow-up with PCP or ENT for further evaluation.     ED Prescriptions     Medication Sig Dispense Auth. Provider   amoxicillin-clavulanate (AUGMENTIN) 875-125 MG tablet Take 1 tablet by mouth 2 (two) times daily for 10 days. 20 tablet Trevor Iha, FNP   fluconazole (DIFLUCAN) 150 MG tablet Take 1 tab p.o. for vaginal candidiasis, may repeat 1 tab p.o. in 3 days if symptoms are not resolved. 4 tablet Trevor Iha, FNP      PDMP not reviewed this encounter.   Trevor Iha, FNP 05/23/22 1931

## 2022-05-23 NOTE — ED Triage Notes (Signed)
Hx of swimmer's ear  since August 2023 Pt's daughter was treated for swimmer's ear & pt has used her daughter's gtts on and off - min relief  Pain became worse in left ear Sinus congestion x 1 week OTC  ibuprofen, sudafed, mucinex

## 2022-08-13 ENCOUNTER — Ambulatory Visit
Admission: RE | Admit: 2022-08-13 | Discharge: 2022-08-13 | Disposition: A | Discharge status: Home / Self Care | Payer: Medicaid Other | Source: Ambulatory Visit

## 2022-08-13 VITALS — BP 109/69 | HR 78 | Temp 98.8°F | Resp 16

## 2022-08-13 DIAGNOSIS — H9203 Otalgia, bilateral: Secondary | ICD-10-CM | POA: Diagnosis not present

## 2022-08-13 NOTE — ED Provider Notes (Signed)
[facility]    CSN: [id number] Arrival date & time: 08/13/22  1811      History   Chief Complaint Chief Complaint  Patient presents with   Otalgia    HPI Shamarria Wacht is a 37 y.o. female.   HPI 37 year old female presents with bilateral ear pain for 3 months.  Patient prescribed antibiotics and is not getting better.  PMH significant for current everyday cigarette smoker.  Patient was evaluated here on 05/23/2022 for acute bilateral otitis media, patient was then prescribed Augmentin.  Patient reports completing this course of antibiotics and ears never got any better.  PMH significant for current everyday cigarette smoker.  History reviewed. No pertinent past medical history.  There are no problems to display for this patient.   Past Surgical History:  Procedure Laterality Date   CESAREAN SECTION      OB History   No obstetric history on file.      Home Medications    Prior to Admission medications   Medication Sig Start Date End Date Taking? Authorizing Provider  fluconazole (DIFLUCAN) 150 MG tablet Take 1 tab p.o. for vaginal candidiasis, may repeat 1 tab p.o. in 3 days if symptoms are not resolved. 05/23/22   Trevor Iha, FNP    Family History Family History  Problem Relation Age of Onset   Cancer Mother    Glaucoma Mother    Osteoarthritis Mother    Fibromyalgia Mother    Diabetes Father     Social History Social History   Tobacco Use   Smoking status: Every Day    Packs/day: .25    Types: Cigarettes   Smokeless tobacco: Never  Vaping Use   Vaping Use: Never used  Substance Use Topics   Alcohol use: Yes    Comment: rarely   Drug use: Not Currently     Allergies   Doxycycline, Cephalexin, and Minocycline   Review of Systems Review of Systems  HENT:  Positive for ear pain.   All other systems reviewed and are negative.    Physical Exam Triage Vital Signs ED Triage Vitals  Enc Vitals Group     BP 08/13/22 1833  109/69     Pulse Rate 08/13/22 1833 78     Resp 08/13/22 1833 16     Temp 08/13/22 1833 98.8 F (37.1 C)     Temp src --      SpO2 08/13/22 1833 98 %     Weight --      Height --      Head Circumference --      Peak Flow --      Pain Score 08/13/22 1832 8     Pain Loc --      Pain Edu? --      Excl. in GC? --    No data found.  Updated Vital Signs BP 109/69   Pulse 78   Temp 98.8 F (37.1 C)   Resp 16   LMP 07/30/2022   SpO2 98%        Physical Exam Vitals and nursing note reviewed.  Constitutional:      Appearance: Normal appearance. She is normal weight.  HENT:     Head: Atraumatic.     Right Ear: External ear normal.     Left Ear: External ear normal.  Eyes:     Extraocular Movements: Extraocular movements intact.     Conjunctiva/sclera: Conjunctivae normal.     Pupils: Pupils are equal, round, and reactive  to light.  Cardiovascular:     Rate and Rhythm: Normal rate and regular rhythm.     Pulses: Normal pulses.     Heart sounds: Normal heart sounds.  Pulmonary:     Effort: Pulmonary effort is normal.     Breath sounds: Normal breath sounds. No wheezing, rhonchi or rales.  Musculoskeletal:        General: Normal range of motion.     Cervical back: Normal range of motion and neck supple.  Skin:    General: Skin is warm and dry.  Neurological:     General: No focal deficit present.     Mental Status: She is alert and oriented to person, place, and time. Mental status is at baseline.  Psychiatric:        Mood and Affect: Mood normal.        Behavior: Behavior normal.      UC Treatments / Results  Labs (all labs ordered are listed, but only abnormal results are displayed) Labs Reviewed - No data to display  EKG   Radiology No results found.  Procedures Procedures (including critical care time)  Medications Ordered in UC Medications - No data to display  Initial Impression / Assessment and Plan / UC Course  I have reviewed the triage  vital signs and the nursing notes.  Pertinent labs & imaging results that were available during my care of the patient were reviewed by me and considered in my medical decision making (see chart for details).     MDM: 1.  Otalgia of both ears-bilateral ear exam completely normal this evening.  Patient became extremely upset with me and demanded antibiotics or an MRI of her brain this evening. Advised patient to follow-up with PCP or ENT with ongoing bilateral otalgia.  Advised patient that bilateral ears were within normal limits and normal on exam this evening.  Contact information is provided below.  Patient discharged home, hemodynamically stable. Final Clinical Impressions(s) / UC Diagnoses   Final diagnoses:  Otalgia of both ears     Discharge Instructions      Advised patient to follow-up with PCP or ENT with ongoing bilateral otalgia.  Advised patient that bilateral ears were within normal limits and normal on exam this evening.  Contact information is provided below.     ED Prescriptions   None    PDMP not reviewed this encounter.   Trevor Iha, FNP 08/13/22 [phone]

## 2022-08-13 NOTE — ED Notes (Signed)
When discharging patient pt st " I don't trust the provider I know I have an infection or it has spread deeper like two my pain. I need an antibiotic"   Informed patient the provider did not see a current ear infection and antibiotics can not be rx unless obvious signs of infection are present due to antibiotic resistance  Pt requesting mri or ct. Explained we do not have the ability to perform these test here at urgent care. If she is concerned for a deeper infection she should follow up at the hospital but otherwise provider recommends an ENT for follow up.    Pt st "if I go to the hospital on my birthday and find out I have an infection and I wasted my time here then he will not have a job"

## 2022-08-13 NOTE — ED Triage Notes (Signed)
Pt presents to [facility] with bilateral ear pain since April. Pt reports she was prescribed antibiotics at that time and has not gotten better at all she reports she finished all antibiotics.

## 2022-08-13 NOTE — Discharge Instructions (Addendum)
Advised patient to follow-up with PCP or ENT with ongoing bilateral otalgia.  Advised patient that bilateral ears were within normal limits and normal on exam this evening.  Contact information is provided below.

## 2023-06-08 ENCOUNTER — Encounter (HOSPITAL_BASED_OUTPATIENT_CLINIC_OR_DEPARTMENT_OTHER): Payer: Self-pay

## 2023-06-08 ENCOUNTER — Emergency Department (HOSPITAL_BASED_OUTPATIENT_CLINIC_OR_DEPARTMENT_OTHER)
Admission: EM | Admit: 2023-06-08 | Discharge: 2023-06-08 | Disposition: A | Discharge status: Home / Self Care | Attending: Emergency Medicine | Admitting: Emergency Medicine

## 2023-06-08 ENCOUNTER — Other Ambulatory Visit: Payer: Self-pay

## 2023-06-08 ENCOUNTER — Emergency Department (HOSPITAL_BASED_OUTPATIENT_CLINIC_OR_DEPARTMENT_OTHER)

## 2023-06-08 DIAGNOSIS — R519 Headache, unspecified: Secondary | ICD-10-CM | POA: Diagnosis present

## 2023-06-08 DIAGNOSIS — K122 Cellulitis and abscess of mouth: Secondary | ICD-10-CM | POA: Diagnosis not present

## 2023-06-08 DIAGNOSIS — H9202 Otalgia, left ear: Secondary | ICD-10-CM

## 2023-06-08 LAB — PREGNANCY, URINE: Preg Test, Ur: NEGATIVE

## 2023-06-08 MED ORDER — AMOXICILLIN 500 MG PO CAPS: 1000.0000 mg | ORAL_CAPSULE | Freq: Two times a day (BID) | ORAL | 0 refills | Status: AC

## 2023-06-08 MED ORDER — IBUPROFEN 800 MG PO TABS: 800.0000 mg | ORAL_TABLET | Freq: Four times a day (QID) | ORAL | 0 refills | Status: AC | PRN

## 2023-06-08 MED ORDER — ACETAMINOPHEN 500 MG PO TABS
1000.0000 mg | ORAL_TABLET | Freq: Once | ORAL | Status: AC
  Administered 2023-06-08: 1000 mg via ORAL
  Filled 2023-06-08: qty 2

## 2023-06-08 NOTE — ED Provider Notes (Signed)
 [facility] Provider Note   CSN: [id number] Arrival date & time: 06/08/23  1529     History  Chief Complaint  Patient presents with   Headache   Dental Pain    Anna Randall is a 38 y.o. female.  Patient to ED with concern for symptoms that started 8 months ago as bilateral ear pain. This was treated with antibiotics without improvement despite her doctor advising her that the ear infection was resolved by exam. No fevers. Since that time she has had continuous sharp, shooting headache on the left side of her head and into the left face. She has also had a progressively worsening swelling on the hard palette of the left. No dental pain or cavities. No facial swelling. She takes Tylenol and ibuprofen with little relief. She notes that in the last 3 days the pain has become severe.   The history is provided by the patient. No language interpreter was used.  Headache      Home Medications Prior to Admission medications   Medication Sig Start Date End Date Taking? Authorizing Provider  amoxicillin  (AMOXIL ) 500 MG capsule Take 2 capsules (1,000 mg total) by mouth 2 (two) times daily. 06/08/23  Yes Jozef Eisenbeis, Clovis Dar, PA-C  ibuprofen (ADVIL) 800 MG tablet Take 1 tablet (800 mg total) by mouth every 6 (six) hours as needed. 06/08/23  Yes Mandy Second, PA-C  fluconazole  (DIFLUCAN ) 150 MG tablet Take 1 tab p.o. for vaginal candidiasis, may repeat 1 tab p.o. in 3 days if symptoms are not resolved. 05/23/22   Leonides Ramp, FNP      Allergies    Doxycycline, Cephalexin, and Minocycline    Review of Systems   Review of Systems  Neurological:  Positive for headaches.    Physical Exam Updated Vital Signs BP 108/77   Pulse 72   Temp 99.2 F (37.3 C)   Resp 16   Ht 5\' 2"  (1.575 m)   Wt 65.8 kg   LMP 05/31/2023 (Approximate)   SpO2 100%   BMI 26.52 kg/m  Physical Exam Vitals and nursing note reviewed.  Constitutional:       Appearance: She is well-developed.  HENT:     Head: Normocephalic.     Right Ear: Tympanic membrane normal.     Left Ear: Tympanic membrane normal.     Ears:     Comments: No pain with ear movement.  Cardiovascular:     Rate and Rhythm: Normal rate and regular rhythm.     Heart sounds: No murmur heard. Pulmonary:     Effort: Pulmonary effort is normal.     Breath sounds: Normal breath sounds. No wheezing, rhonchi or rales.  Abdominal:     General: Bowel sounds are normal.     Palpations: Abdomen is soft.     Tenderness: There is no abdominal tenderness. There is no guarding or rebound.  Musculoskeletal:        General: Normal range of motion.     Cervical back: Normal range of motion and neck supple.  Skin:    General: Skin is warm and dry.  Neurological:     General: No focal deficit present.     Mental Status: She is alert and oriented to person, place, and time.     GCS: GCS eye subscore is 4. GCS verbal subscore is 5. GCS motor subscore is 6.     Cranial Nerves: Cranial nerves 2-12 are intact.     Sensory:  Sensation is intact.     Motor: No pronator drift.     Coordination: Finger-Nose-Finger Test normal.     Gait: Gait normal.     ED Results / Procedures / Treatments   Labs (all labs ordered are listed, but only abnormal results are displayed) Labs Reviewed  PREGNANCY, URINE    EKG None  Radiology CT Head Wo Contrast Result Date: 06/08/2023 CLINICAL DATA:  Facial pain and headache. EXAM: CT HEAD WITHOUT CONTRAST CT MAXILLOFACIAL WITHOUT CONTRAST TECHNIQUE: Multidetector CT imaging of the head and maxillofacial structures were performed using the standard protocol without intravenous contrast. Multiplanar CT image reconstructions of the maxillofacial structures were also generated. RADIATION DOSE REDUCTION: This exam was performed according to the departmental dose-optimization program which includes automated exposure control, adjustment of the mA and/or kV  according to patient size and/or use of iterative reconstruction technique. COMPARISON:  None Available. FINDINGS: CT HEAD FINDINGS Brain: The ventricles and sulci are appropriate size for the patient's age. The gray-white matter discrimination is preserved. There is no acute intracranial hemorrhage. No mass effect or midline shift. No extra-axial fluid collection. Vascular: No hyperdense vessel or unexpected calcification. Skull: Normal. Negative for fracture or focal lesion. Other: None CT MAXILLOFACIAL FINDINGS Osseous: No fracture or mandibular dislocation. No destructive process. Orbits: Negative. No traumatic or inflammatory finding. Sinuses: Clear. Soft tissues: Negative. IMPRESSION: 1. Unremarkable noncontrast CT of the brain. 2. No acute/traumatic maxillofacial pathology. Electronically Signed   By: Angus Bark M.D.   On: 06/08/2023 17:44   CT Maxillofacial Wo Contrast Result Date: 06/08/2023 CLINICAL DATA:  Facial pain and headache. EXAM: CT HEAD WITHOUT CONTRAST CT MAXILLOFACIAL WITHOUT CONTRAST TECHNIQUE: Multidetector CT imaging of the head and maxillofacial structures were performed using the standard protocol without intravenous contrast. Multiplanar CT image reconstructions of the maxillofacial structures were also generated. RADIATION DOSE REDUCTION: This exam was performed according to the departmental dose-optimization program which includes automated exposure control, adjustment of the mA and/or kV according to patient size and/or use of iterative reconstruction technique. COMPARISON:  None Available. FINDINGS: CT HEAD FINDINGS Brain: The ventricles and sulci are appropriate size for the patient's age. The gray-white matter discrimination is preserved. There is no acute intracranial hemorrhage. No mass effect or midline shift. No extra-axial fluid collection. Vascular: No hyperdense vessel or unexpected calcification. Skull: Normal. Negative for fracture or focal lesion. Other: None CT  MAXILLOFACIAL FINDINGS Osseous: No fracture or mandibular dislocation. No destructive process. Orbits: Negative. No traumatic or inflammatory finding. Sinuses: Clear. Soft tissues: Negative. IMPRESSION: 1. Unremarkable noncontrast CT of the brain. 2. No acute/traumatic maxillofacial pathology. Electronically Signed   By: Angus Bark M.D.   On: 06/08/2023 17:44    Procedures Procedures    Medications Ordered in ED Medications  acetaminophen (TYLENOL) tablet 1,000 mg (1,000 mg Oral Given 06/08/23 1704)    ED Course/ Medical Decision Making/ A&P                                 Medical Decision Making This patient presents to the ED for concern of unilateral headache, this involves an extensive number of treatment options, and is a complaint that carries with it a high risk of complications and morbidity.  The differential diagnosis includes bleed, mass, trigeminal neuralgia, shingles, deep space abscess   Co morbidities that complicate the patient evaluation  No significant medical conditions   Additional history obtained:  Additional history and/or information  obtained from chart review, notable for n/a   Lab Tests:  I Ordered, and personally interpreted labs.  The pertinent results include:  pregnancy:    Imaging Studies ordered:  I ordered imaging studies including Head CT, maxillofacial CT Per radiologist interpretation: NAD  Cardiac Monitoring:  The patient was maintained on a cardiac monitor.  I personally viewed and interpreted the cardiac monitored which showed an underlying rhythm of: n/a   Medicines ordered and prescription drug management:  I ordered medication including Tylenol  for pain Reevaluation of the patient after these medicines showed that the patient improved I have reviewed the patients home medicines and have made adjustments as needed   Test Considered:  N/a   Critical Interventions:  N/a   Consultations Obtained:  I requested  consultation with the na/,  and discussed lab and imaging findings as well as pertinent plan - they recommend: n/a   Problem List / ED Course:  Unilateral headache, ongoing, becoming severe over last 3 days.  She feels it originates in the ear but then extends to left side of head and face.  Has swelling of hard palette as well.  Well appearing, in NAD, VSS Head and face CT negative Will provide referral to ENT for ear, neuro if eval is negative for headache.  Also discussed need to see dentistry for recheck of the oral swelling.  Augmentin  x 10 days, ibuprofen 800 mg   Reevaluation:  After the interventions noted above, I reevaluated the patient and found that they have :improved   Social Determinants of Health:  Lives with husband   Disposition:  After consideration of the diagnostic results and the patients response to treatment, I feel that the patient would benefit from discharge home to outpatient follow up.   Amount and/or Complexity of Data Reviewed Labs: ordered. Radiology: ordered.  Risk OTC drugs.           Final Clinical Impression(s) / ED Diagnoses Final diagnoses:  Ear pain, left  Unilateral headache  Oral infection    Rx / DC Orders ED Discharge Orders          Ordered    amoxicillin  (AMOXIL ) 500 MG capsule  2 times daily        06/08/23 1818    ibuprofen (ADVIL) 800 MG tablet  Every 6 hours PRN        06/08/23 1818              Mandy Second, PA-C 06/08/23 1820    Mordecai Applebaum, MD 06/08/23 1930

## 2023-06-08 NOTE — ED Triage Notes (Signed)
 Reports worsening L sided headache for 1 year. Reports swelling on back L side of jaw.   Denies weakness, dizziness, visual changes  Denies difficulty swallowing, airway patent

## 2023-06-08 NOTE — Discharge Instructions (Signed)
 As we discussed, no cause is identified for your left sided pain. An antibiotic has been prescribed, along with ibuprofen 800 mg as requested.   Follow up with ENT. If no answer is found, consider follow up with neurology.

## 2023-06-08 NOTE — ED Notes (Signed)
 Swelling to left upper gum for  a while  ? A year ,making her head hurt and ear hurt had a dental appointment but when she arrived they told her dr wasn't there
